# Patient Record
Sex: Male | Born: 1955 | Race: White | Hispanic: No | Marital: Married | Smoking: Never smoker
Health system: Southern US, Community
[De-identification: ages and names within clinical notes are randomized; demographics above are authoritative.]

## PROBLEM LIST (undated history)

## (undated) DIAGNOSIS — K5792 Diverticulitis of intestine, part unspecified, without perforation or abscess without bleeding: Secondary | ICD-10-CM

## (undated) DIAGNOSIS — F419 Anxiety disorder, unspecified: Secondary | ICD-10-CM

## (undated) DIAGNOSIS — R251 Tremor, unspecified: Secondary | ICD-10-CM

## (undated) DIAGNOSIS — F32A Depression, unspecified: Secondary | ICD-10-CM

## (undated) HISTORY — PX: COLONOSCOPY: SHX174

## (undated) HISTORY — PX: KNEE SURGERY: SHX244

---

## 2009-10-30 HISTORY — PX: COLONOSCOPY: SHX174

## 2010-07-21 ENCOUNTER — Ambulatory Visit: Payer: Self-pay | Admitting: Unknown Physician Specialty

## 2011-09-27 ENCOUNTER — Ambulatory Visit: Payer: Self-pay | Admitting: Unknown Physician Specialty

## 2013-10-31 ENCOUNTER — Ambulatory Visit: Payer: Self-pay | Admitting: Internal Medicine

## 2013-11-12 ENCOUNTER — Ambulatory Visit: Payer: Self-pay | Admitting: Internal Medicine

## 2013-12-05 ENCOUNTER — Emergency Department: Payer: Self-pay | Admitting: Emergency Medicine

## 2013-12-06 LAB — CBC
HCT: 45.2 % (ref 40.0–52.0)
HGB: 15.8 g/dL (ref 13.0–18.0)
MCH: 32.1 pg (ref 26.0–34.0)
MCHC: 34.9 g/dL (ref 32.0–36.0)
MCV: 92 fL (ref 80–100)
Platelet: 285 10*3/uL (ref 150–440)
RBC: 4.91 10*6/uL (ref 4.40–5.90)
RDW: 12.4 % (ref 11.5–14.5)
WBC: 7.6 10*3/uL (ref 3.8–10.6)

## 2013-12-06 LAB — BASIC METABOLIC PANEL
ANION GAP: 4 — AB (ref 7–16)
BUN: 14 mg/dL (ref 7–18)
CO2: 30 mmol/L (ref 21–32)
Calcium, Total: 8.6 mg/dL (ref 8.5–10.1)
Chloride: 105 mmol/L (ref 98–107)
Creatinine: 1.16 mg/dL (ref 0.60–1.30)
EGFR (Non-African Amer.): >60
Glucose: 130 mg/dL — ABNORMAL HIGH (ref 65–99)
Osmolality: 280 (ref 275–301)
Potassium: 3.5 mmol/L (ref 3.5–5.1)
SODIUM: 139 mmol/L (ref 136–145)

## 2013-12-06 LAB — CK: CK, Total: 269 U/L

## 2013-12-06 LAB — TSH: Thyroid Stimulating Horm: 3.38 u[IU]/mL

## 2013-12-06 LAB — TROPONIN I: Troponin-I: <0.02 ng/mL

## 2014-08-03 ENCOUNTER — Ambulatory Visit: Payer: Self-pay | Admitting: Internal Medicine

## 2015-10-01 IMAGING — CT CT ABD-PELV W/ CM
2 of 5 series · 16 of 46 positions shown, 18 images · IV contrast (isovue)
Comparison: None.

CLINICAL DATA: Left lower quadrant pain for 6 months with diarrhea

EXAM:
CT ABDOMEN AND PELVIS WITH CONTRAST
TECHNIQUE: Multidetector CT imaging of the abdomen and pelvis was performed
using the standard protocol following bolus administration of
intravenous contrast.
CONTRAST:  100 mL of Isovue 370

[Series 2: routine with · axial · 0.70mm/px · z∈[-918,-508]mm · 13 of 92 slices shown, 15 images]
[im 5/92  soft-tissue]
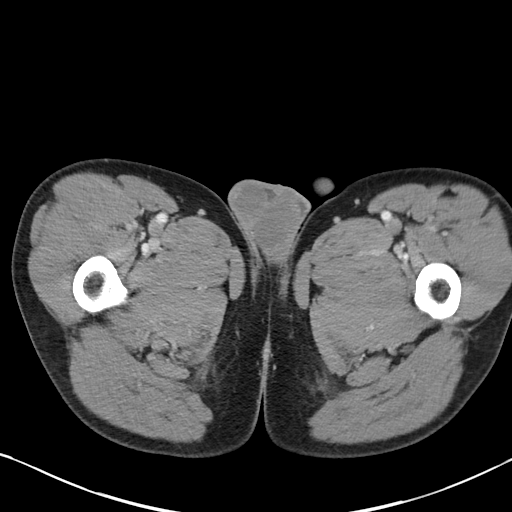
[im 5/92  bone]
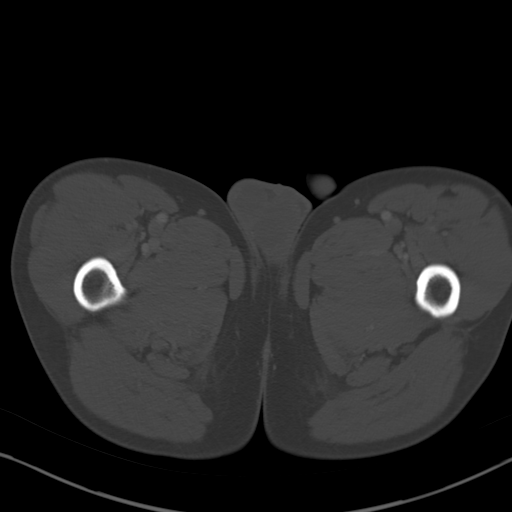
[im 15/92  soft-tissue]
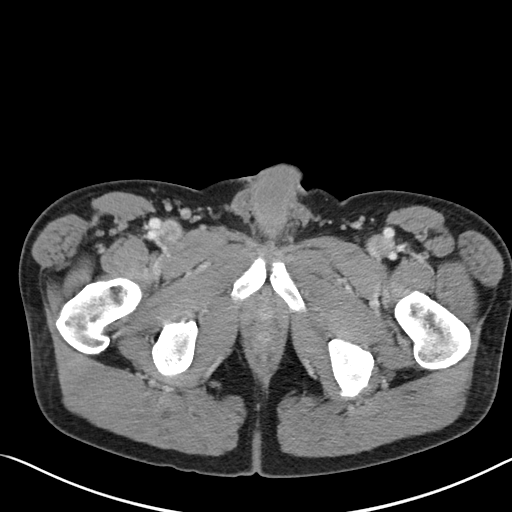
[im 20/92  soft-tissue]
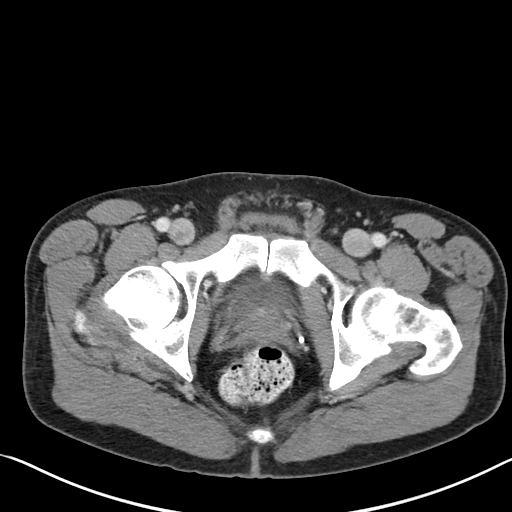
[im 24/92  soft-tissue]
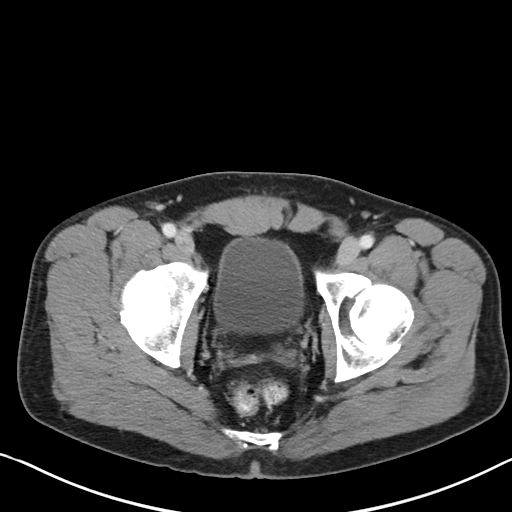
[im 34/92  soft-tissue]
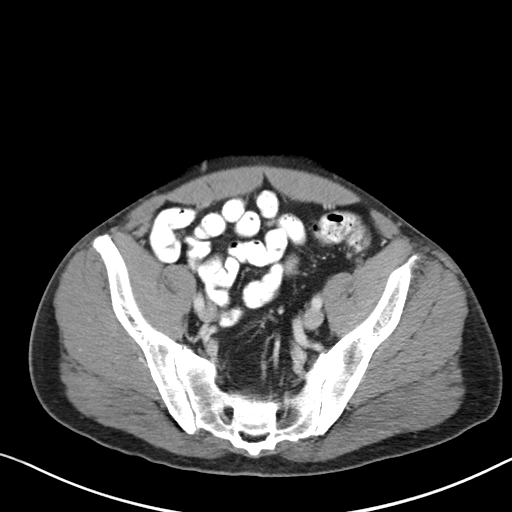
[im 39/92  soft-tissue]
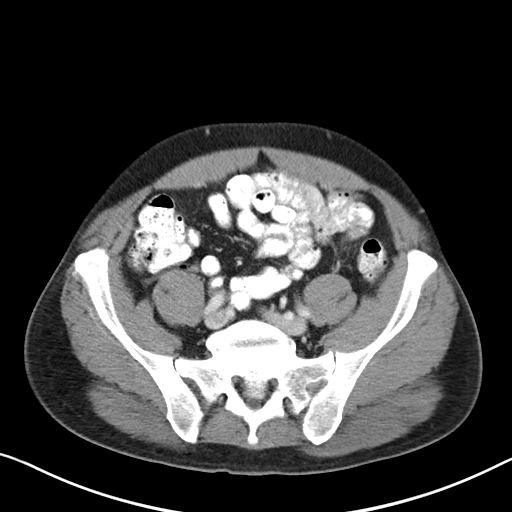
[im 48/92  soft-tissue]
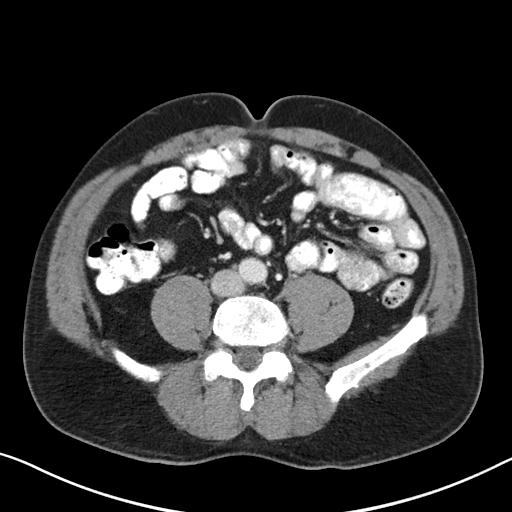
[im 53/92  soft-tissue]
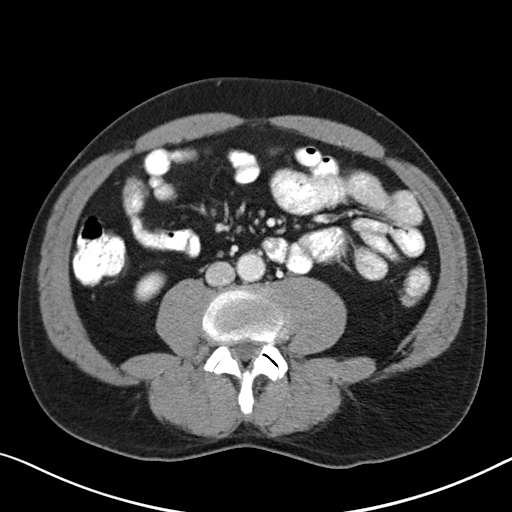
[im 58/92  soft-tissue]
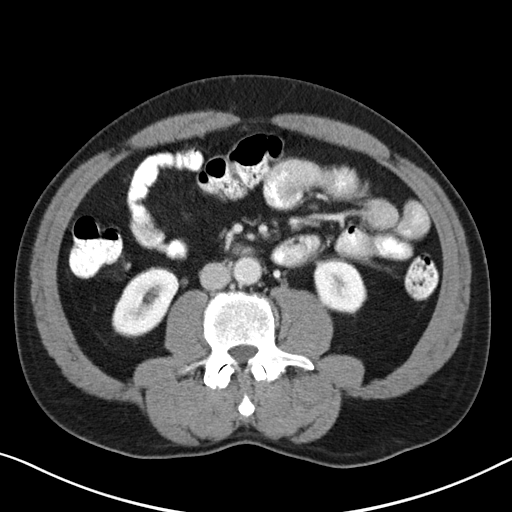
[im 58/92  bone]
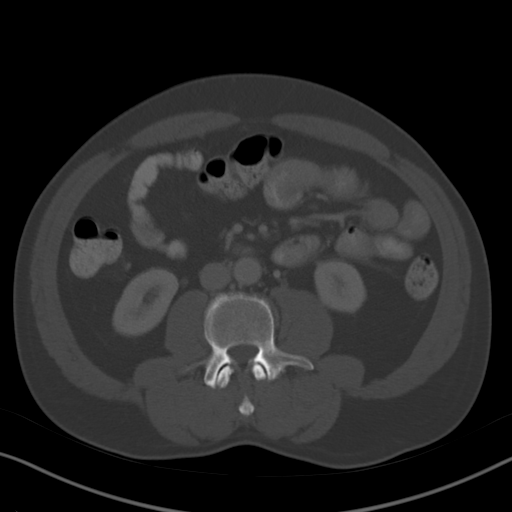
[im 68/92  soft-tissue]
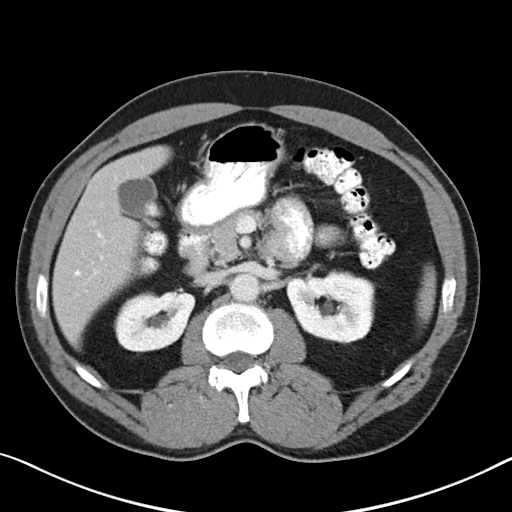
[im 72/92  soft-tissue]
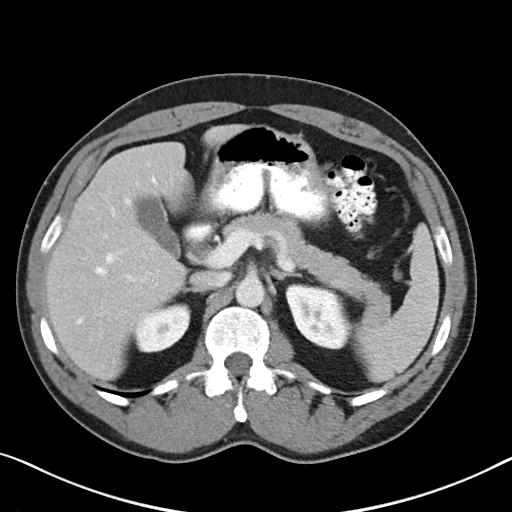
[im 77/92  soft-tissue]
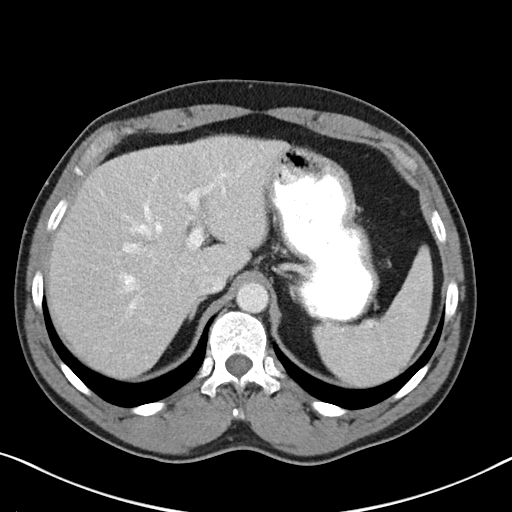
[im 87/92  soft-tissue]
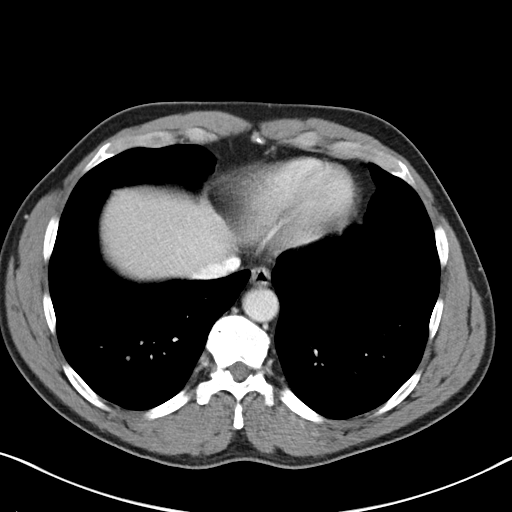

[Series 6: cor routine with · coronal · 0.77mm/px · 3 of 150 slices shown]
[im 50/150  soft-tissue]
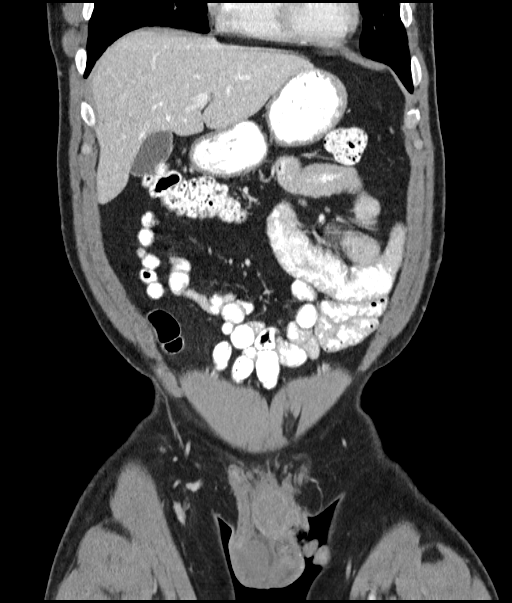
[im 67/150  soft-tissue]
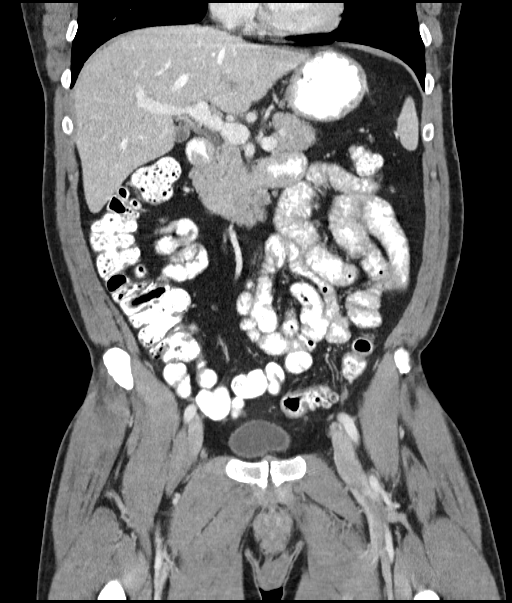
[im 83/150  soft-tissue]
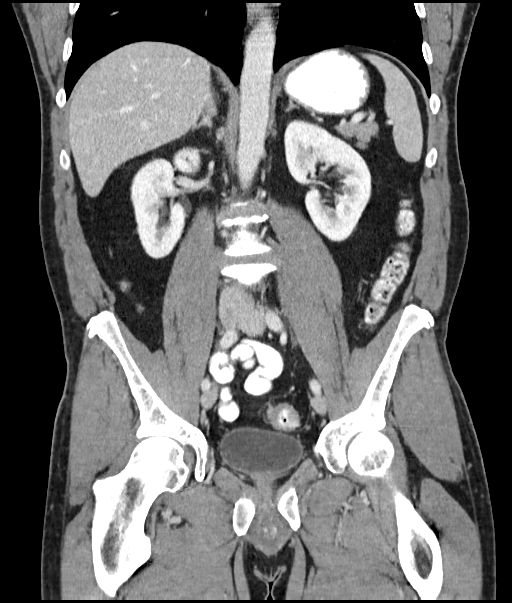

[16 of 46 positions shown; findings below may reference images not displayed]

FINDINGS: The lung bases are clear.

The liver demonstrates no focal abnormality. There is no
intrahepatic or extrahepatic biliary ductal dilatation. The
gallbladder is normal. The spleen demonstrates no focal abnormality.
There is a 3 mm nonobstructing left renal calculus in the midpole.
The right kidney, adrenal glands and pancreas are normal. The
bladder is unremarkable.

The stomach, duodenum, small intestine, and large intestine
demonstrate no contrast extravasation or dilatation. There is a
normal caliber appendix in the right lower quadrant without
periappendiceal inflammatory changes. There is no pneumoperitoneum,
pneumatosis, or portal venous gas. There is no abdominal or pelvic
free fluid. There is no lymphadenopathy.

The abdominal aorta is normal in caliber with atherosclerosis.

There is a 2.1 cm sclerotic lesion in the right side of the L4
vertebral body with a smaller 12 mm lesion slightly more posteriorly
within the L4 vertebral body. There is degenerative disc disease at
L5-S1.
IMPRESSION: 1.  No acute abdominal or pelvic pathology.

2. There is a 2.1 cm sclerotic lesion in the right side of the L4
vertebral body with a smaller 12 mm lesion slightly more posteriorly
within the L4 vertebral body. This may represent a hemangioma versus
other sclerotic lesion. Further evaluation with MRI may be helpful
in characterizing lesion.

3.  Nonobstructing left renal calculus.

## 2018-04-23 ENCOUNTER — Other Ambulatory Visit: Payer: Self-pay | Admitting: Internal Medicine

## 2018-04-23 DIAGNOSIS — M79661 Pain in right lower leg: Secondary | ICD-10-CM

## 2020-10-20 DIAGNOSIS — Z8616 Personal history of COVID-19: Secondary | ICD-10-CM

## 2020-10-20 HISTORY — DX: Personal history of COVID-19: Z86.16

## 2022-02-10 DIAGNOSIS — J209 Acute bronchitis, unspecified: Secondary | ICD-10-CM | POA: Diagnosis not present

## 2022-02-10 DIAGNOSIS — R509 Fever, unspecified: Secondary | ICD-10-CM | POA: Diagnosis not present

## 2022-02-10 DIAGNOSIS — R110 Nausea: Secondary | ICD-10-CM | POA: Diagnosis not present

## 2022-02-10 DIAGNOSIS — Z1331 Encounter for screening for depression: Secondary | ICD-10-CM | POA: Diagnosis not present

## 2022-02-10 DIAGNOSIS — R0982 Postnasal drip: Secondary | ICD-10-CM | POA: Diagnosis not present

## 2022-02-10 DIAGNOSIS — Z03818 Encounter for observation for suspected exposure to other biological agents ruled out: Secondary | ICD-10-CM | POA: Diagnosis not present

## 2022-02-10 DIAGNOSIS — Z0000 Encounter for general adult medical examination without abnormal findings: Secondary | ICD-10-CM | POA: Diagnosis not present

## 2022-04-07 DIAGNOSIS — H2513 Age-related nuclear cataract, bilateral: Secondary | ICD-10-CM | POA: Diagnosis not present

## 2022-05-16 DIAGNOSIS — H25042 Posterior subcapsular polar age-related cataract, left eye: Secondary | ICD-10-CM | POA: Diagnosis not present

## 2022-05-22 ENCOUNTER — Encounter: Payer: Self-pay | Admitting: Ophthalmology

## 2022-05-25 NOTE — Discharge Instructions (Signed)

## 2022-05-29 NOTE — Anesthesia Preprocedure Evaluation (Addendum)
Anesthesia Evaluation  Patient identified by MRN, date of birth, ID band Patient awake    Reviewed: Allergy & Precautions, NPO status , Patient's Chart, lab work & pertinent test results  History of Anesthesia Complications Negative for: history of anesthetic complications  Airway Mallampati: III  TM Distance: >3 FB Neck ROM: full    Dental  (+) Chipped   Pulmonary neg pulmonary ROS,    Pulmonary exam normal        Cardiovascular negative cardio ROS Normal cardiovascular exam     Neuro/Psych PSYCHIATRIC DISORDERS Anxiety Depression negative neurological ROS     GI/Hepatic negative GI ROS, Neg liver ROS,   Endo/Other  negative endocrine ROS  Renal/GU negative Renal ROS  negative genitourinary   Musculoskeletal negative musculoskeletal ROS (+)   Abdominal   Peds negative pediatric ROS (+)  Hematology negative hematology ROS (+)   Anesthesia Other Findings Past Medical History: No date: Anxiety No date: Depression No date: Diverticulitis 10/20/2020: History of COVID-19 No date: Tremor  Past Surgical History: 2011: COLONOSCOPY No date: COLONOSCOPY     Comment:  2015 No date: KNEE SURGERY; Left  BMI    Body Mass Index: 24.63 kg/m      Reproductive/Obstetrics negative OB ROS                             Anesthesia Physical Anesthesia Plan  ASA: 2  Anesthesia Plan: MAC   Post-op Pain Management:    Induction:   PONV Risk Score and Plan:   Airway Management Planned:   Additional Equipment:   Intra-op Plan:   Post-operative Plan:   Informed Consent:   Plan Discussed with: Anesthesiologist, CRNA and Surgeon  Anesthesia Plan Comments:         Anesthesia Quick Evaluation

## 2022-05-30 ENCOUNTER — Encounter: Payer: Self-pay | Admitting: Ophthalmology

## 2022-05-30 ENCOUNTER — Other Ambulatory Visit: Payer: Self-pay

## 2022-05-30 ENCOUNTER — Ambulatory Visit: Payer: No Typology Code available for payment source | Admitting: Anesthesiology

## 2022-05-30 ENCOUNTER — Encounter
Admission: RE | Disposition: A | Discharge status: Home / Self Care | Payer: Self-pay | Source: Home / Self Care | Attending: Ophthalmology

## 2022-05-30 ENCOUNTER — Ambulatory Visit
Admission: RE | Admit: 2022-05-30 | Discharge: 2022-05-30 | Disposition: A | Discharge status: Home / Self Care | Payer: No Typology Code available for payment source | Attending: Ophthalmology | Admitting: Ophthalmology

## 2022-05-30 ENCOUNTER — Ambulatory Visit (AMBULATORY_SURGERY_CENTER): Payer: No Typology Code available for payment source | Admitting: Anesthesiology

## 2022-05-30 DIAGNOSIS — H2512 Age-related nuclear cataract, left eye: Secondary | ICD-10-CM | POA: Insufficient documentation

## 2022-05-30 DIAGNOSIS — F418 Other specified anxiety disorders: Secondary | ICD-10-CM

## 2022-05-30 DIAGNOSIS — K5792 Diverticulitis of intestine, part unspecified, without perforation or abscess without bleeding: Secondary | ICD-10-CM

## 2022-05-30 HISTORY — PX: CATARACT EXTRACTION W/PHACO: SHX586

## 2022-05-30 HISTORY — DX: Diverticulitis of intestine, part unspecified, without perforation or abscess without bleeding: K57.92

## 2022-05-30 HISTORY — DX: Anxiety disorder, unspecified: F41.9

## 2022-05-30 HISTORY — DX: Tremor, unspecified: R25.1

## 2022-05-30 HISTORY — DX: Depression, unspecified: F32.A

## 2022-05-30 SURGERY — PHACOEMULSIFICATION, CATARACT, WITH IOL INSERTION
Anesthesia: Monitor Anesthesia Care | Site: Eye | Laterality: Left

## 2022-05-30 MED ORDER — SIGHTPATH DOSE#1 BSS IO SOLN
INTRAOCULAR | Status: DC | PRN
  Administered 2022-05-30: 75 mL via OPHTHALMIC

## 2022-05-30 MED ORDER — TETRACAINE HCL 0.5 % OP SOLN
1.0000 [drp] | OPHTHALMIC | Status: DC | PRN
  Administered 2022-05-30 (×3): 1 [drp] via OPHTHALMIC

## 2022-05-30 MED ORDER — SIGHTPATH DOSE#1 BSS IO SOLN
INTRAOCULAR | Status: DC | PRN
  Administered 2022-05-30: 15 mL

## 2022-05-30 MED ORDER — FENTANYL CITRATE (PF) 100 MCG/2ML IJ SOLN
INTRAMUSCULAR | Status: DC | PRN
Start: 2022-05-30 — End: 2022-05-30
  Administered 2022-05-30: 50 ug via INTRAVENOUS

## 2022-05-30 MED ORDER — MOXIFLOXACIN HCL 0.5 % OP SOLN
OPHTHALMIC | Status: DC | PRN
  Administered 2022-05-30: 0.2 mL via OPHTHALMIC

## 2022-05-30 MED ORDER — SIGHTPATH DOSE#1 NA CHONDROIT SULF-NA HYALURON 40-17 MG/ML IO SOLN
INTRAOCULAR | Status: DC | PRN
  Administered 2022-05-30: 1 mL via INTRAOCULAR

## 2022-05-30 MED ORDER — ARMC OPHTHALMIC DILATING DROPS
1.0000 | OPHTHALMIC | Status: DC | PRN
  Administered 2022-05-30 (×3): 1 via OPHTHALMIC

## 2022-05-30 MED ORDER — MIDAZOLAM HCL 2 MG/2ML IJ SOLN
INTRAMUSCULAR | Status: DC | PRN
  Administered 2022-05-30: 1 mg via INTRAVENOUS

## 2022-05-30 MED ORDER — BRIMONIDINE TARTRATE-TIMOLOL 0.2-0.5 % OP SOLN
OPHTHALMIC | Status: DC | PRN
  Administered 2022-05-30: 1 [drp] via OPHTHALMIC

## 2022-05-30 MED ORDER — SIGHTPATH DOSE#1 BSS IO SOLN
INTRAOCULAR | Status: DC | PRN
  Administered 2022-05-30: 1 mL via INTRAMUSCULAR

## 2022-05-30 SURGICAL SUPPLY — 10 items
CATARACT SUITE SIGHTPATH (MISCELLANEOUS) ×2 IMPLANT
FEE CATARACT SUITE SIGHTPATH (MISCELLANEOUS) ×1 IMPLANT
GLOVE SURG ENC TEXT LTX SZ8 (GLOVE) ×2 IMPLANT
GLOVE SURG TRIUMPH 8.0 PF LTX (GLOVE) ×2 IMPLANT
LENS IOL TECNIS EYHANCE 21.5 (Intraocular Lens) ×1 IMPLANT
NDL FILTER BLUNT 18X1 1/2 (NEEDLE) ×1 IMPLANT
NEEDLE FILTER BLUNT 18X 1/2SAF (NEEDLE) ×1
NEEDLE FILTER BLUNT 18X1 1/2 (NEEDLE) ×1 IMPLANT
SYR 3ML LL SCALE MARK (SYRINGE) ×2 IMPLANT
WATER STERILE IRR 250ML POUR (IV SOLUTION) ×2 IMPLANT

## 2022-05-30 NOTE — Op Note (Signed)
PREOPERATIVE DIAGNOSIS:  Nuclear sclerotic cataract of the left eye.   POSTOPERATIVE DIAGNOSIS:  Nuclear sclerotic cataract of the left eye.   OPERATIVE PROCEDURE:ORPROCALL@   SURGEON:  Galen Manila, MD.   ANESTHESIA:  Anesthesiologist: Foye Deer, MD CRNA: Lynden Oxford, CRNA  1.      Managed anesthesia care. 2.     0.55ml of Shugarcaine was instilled following the paracentesis   COMPLICATIONS:  None.   TECHNIQUE:   Stop and chop   DESCRIPTION OF PROCEDURE:  The patient was examined and consented in the preoperative holding area where the aforementioned topical anesthesia was applied to the left eye and then brought back to the Operating Room where the left eye was prepped and draped in the usual sterile ophthalmic fashion and a lid speculum was placed. A paracentesis was created with the side port blade and the anterior chamber was filled with viscoelastic. A near clear corneal incision was performed with the steel keratome. A continuous curvilinear capsulorrhexis was performed with a cystotome followed by the capsulorrhexis forceps. Hydrodissection and hydrodelineation were carried out with BSS on a blunt cannula. The lens was removed in a stop and chop  technique and the remaining cortical material was removed with the irrigation-aspiration handpiece. The capsular bag was inflated with viscoelastic and the Technis ZCB00 lens was placed in the capsular bag without complication. The remaining viscoelastic was removed from the eye with the irrigation-aspiration handpiece. The wounds were hydrated. The anterior chamber was flushed with BSS and the eye was inflated to physiologic pressure. 0.60ml Vigamox was placed in the anterior chamber. The wounds were found to be water tight. The eye was dressed with Combigan. The patient was given protective glasses to wear throughout the day and a shield with which to sleep tonight. The patient was also given drops with which to begin a  drop regimen today and will follow-up with me in one day. Implant Name Type Inv. Item Serial No. Manufacturer Lot No. LRB No. Used Action  LENS IOL TECNIS EYHANCE 21.5 - [id number] Intraocular Lens LENS IOL TECNIS EYHANCE 21.5 [id number] [employer]  Left 1 Implanted    Procedure(s) with comments: CATARACT EXTRACTION PHACO AND INTRAOCULAR LENS PLACEMENT (IOC) LEFT (Left) - 21.14 01:40.8  Electronically signed: Galen Manila 05/30/2022 11:24 AM

## 2022-05-30 NOTE — H&P (Signed)
[facility]   Primary Care Physician:  Patient, No Pcp Per Ophthalmologist: Dr. Maren Reamer  Pre-Procedure History & Physical: HPI:  Adam Holmes is a 66 y.o. male here for cataract surgery.   Past Medical History:  Diagnosis Date   Anxiety    Depression    Diverticulitis    History of COVID-19 10/20/2020   Tremor     Past Surgical History:  Procedure Laterality Date   COLONOSCOPY  2011   COLONOSCOPY     2015   KNEE SURGERY Left     Prior to Admission medications   Medication Sig Start Date End Date Taking? Authorizing Provider  famotidine (PEPCID) 20 MG tablet Take 20 mg by mouth 2 (two) times daily.   Yes [provider]  PHENIRAMINE-PHENYLEPHRINE PO Take by mouth.   Yes [provider]    Allergies as of 04/11/2022   (Not on File)    History reviewed. No pertinent family history.  Social History   Socioeconomic History   Marital status: Married    Spouse name: Not on file   Number of children: Not on file   Years of education: Not on file   Highest education level: Not on file  Occupational History   Not on file  Tobacco Use   Smoking status: Never   Smokeless tobacco: Not on file  Substance and Sexual Activity   Alcohol use: Not on file   Drug use: Not on file   Sexual activity: Not on file  Other Topics Concern   Not on file  Social History Narrative   Not on file   Social Determinants of Health   Financial Resource Strain: Not on file  Food Insecurity: Not on file  Transportation Needs: Not on file  Physical Activity: Not on file  Stress: Not on file  Social Connections: Not on file  Intimate Partner Violence: Not on file    Review of Systems: See HPI, otherwise negative ROS  Physical Exam: BP (!) 144/89   Pulse (!) 53   Temp 98 F (36.7 C) (Temporal)   Resp 20   Ht 5\' 8"  (1.727 m)   Wt 68.5 kg   SpO2 98%   BMI 22.96 kg/m  General:   Alert, cooperative in NAD Head:  Normocephalic and  atraumatic. Respiratory:  Normal work of breathing. Cardiovascular:  RRR  Impression/Plan: Holmes is here for cataract surgery.  Risks, benefits, limitations, and alternatives regarding cataract surgery have been reviewed with the patient.  Questions have been answered.  All parties agreeable.   Duke Salvia, MD  05/30/2022, 10:56 AM

## 2022-05-30 NOTE — Transfer of Care (Signed)
Immediate Anesthesia Transfer of Care Note  Patient: Adam Holmes  Procedure(s) Performed: CATARACT EXTRACTION PHACO AND INTRAOCULAR LENS PLACEMENT (IOC) LEFT (Left: Eye)  Patient Location: PACU  Anesthesia Type:MAC  Level of Consciousness: drowsy and patient cooperative  Airway & Oxygen Therapy: Patient Spontanous Breathing  Post-op Assessment: Report given to RN and Post -op Vital signs reviewed and stable  Post vital signs: Reviewed and stable  Last Vitals:  Vitals Value Taken Time  BP    Temp    Pulse    Resp    SpO2      Last Pain:  Vitals:   05/30/22 0952  TempSrc: Temporal  PainSc: 0-No pain         Complications: No notable events documented.

## 2022-05-30 NOTE — Anesthesia Postprocedure Evaluation (Signed)
Anesthesia Post Note  Patient: Adam Holmes  Procedure(s) Performed: CATARACT EXTRACTION PHACO AND INTRAOCULAR LENS PLACEMENT (IOC) LEFT (Left: Eye)     Patient location during evaluation: PACU Anesthesia Type: MAC Level of consciousness: awake and alert Pain management: pain level controlled Vital Signs Assessment: post-procedure vital signs reviewed and stable Respiratory status: spontaneous breathing, nonlabored ventilation and respiratory function stable Cardiovascular status: stable and blood pressure returned to baseline Postop Assessment: no apparent nausea or vomiting Anesthetic complications: no   No notable events documented.  [place] Ouch

## 2022-05-31 ENCOUNTER — Encounter: Payer: Self-pay | Admitting: Ophthalmology

## 2022-06-05 DIAGNOSIS — H2511 Age-related nuclear cataract, right eye: Secondary | ICD-10-CM | POA: Diagnosis not present

## 2022-06-07 NOTE — Discharge Instructions (Signed)

## 2022-06-10 NOTE — Anesthesia Preprocedure Evaluation (Signed)
Anesthesia Evaluation  Patient identified by MRN, date of birth, ID band Patient awake    Reviewed: Allergy & Precautions, NPO status , Patient's Chart, lab work & pertinent test results  History of Anesthesia Complications Negative for: history of anesthetic complications  Airway Mallampati: I  TM Distance: >3 FB Neck ROM: full    Dental no notable dental hx.    Pulmonary neg pulmonary ROS,    Pulmonary exam normal        Cardiovascular negative cardio ROS Normal cardiovascular exam     Neuro/Psych negative neurological ROS  negative psych ROS   GI/Hepatic negative GI ROS, Neg liver ROS,   Endo/Other  negative endocrine ROS  Renal/GU negative Renal ROS  negative genitourinary   Musculoskeletal negative musculoskeletal ROS (+)   Abdominal Normal abdominal exam  (+)   Peds negative pediatric ROS (+)  Hematology negative hematology ROS (+)   Anesthesia Other Findings Past Medical History: No date: Anxiety No date: Depression No date: Diverticulitis 10/20/2020: History of COVID-19 No date: Tremor  Past Surgical History: 2011: COLONOSCOPY No date: COLONOSCOPY     Comment:  2015 No date: KNEE SURGERY; Left  BMI    Body Mass Index: 24.63 kg/m      Reproductive/Obstetrics negative OB ROS                             Anesthesia Physical  Anesthesia Plan  ASA: 2  Anesthesia Plan: MAC   Post-op Pain Management:    Induction:   PONV Risk Score and Plan: Treatment may vary due to age or medical condition  Airway Management Planned: Natural Airway  Additional Equipment:   Intra-op Plan:   Post-operative Plan:   Informed Consent:     Dental advisory given  Plan Discussed with: Anesthesiologist, CRNA and Surgeon  Anesthesia Plan Comments:         Anesthesia Quick Evaluation

## 2022-06-13 ENCOUNTER — Ambulatory Visit: Payer: No Typology Code available for payment source | Admitting: Anesthesiology

## 2022-06-13 ENCOUNTER — Ambulatory Visit
Admission: RE | Admit: 2022-06-13 | Discharge: 2022-06-13 | Disposition: A | Discharge status: Home / Self Care | Payer: No Typology Code available for payment source | Attending: Ophthalmology | Admitting: Ophthalmology

## 2022-06-13 ENCOUNTER — Encounter: Payer: Self-pay | Admitting: Ophthalmology

## 2022-06-13 ENCOUNTER — Other Ambulatory Visit: Payer: Self-pay

## 2022-06-13 ENCOUNTER — Ambulatory Visit (AMBULATORY_SURGERY_CENTER): Payer: No Typology Code available for payment source | Admitting: Anesthesiology

## 2022-06-13 ENCOUNTER — Encounter
Admission: RE | Disposition: A | Discharge status: Home / Self Care | Payer: Self-pay | Source: Home / Self Care | Attending: Ophthalmology

## 2022-06-13 DIAGNOSIS — F418 Other specified anxiety disorders: Secondary | ICD-10-CM

## 2022-06-13 DIAGNOSIS — H2511 Age-related nuclear cataract, right eye: Secondary | ICD-10-CM

## 2022-06-13 HISTORY — PX: CATARACT EXTRACTION W/PHACO: SHX586

## 2022-06-13 SURGERY — PHACOEMULSIFICATION, CATARACT, WITH IOL INSERTION
Anesthesia: Monitor Anesthesia Care | Site: Eye | Laterality: Right

## 2022-06-13 MED ORDER — FENTANYL CITRATE (PF) 100 MCG/2ML IJ SOLN
INTRAMUSCULAR | Status: DC | PRN
  Administered 2022-06-13: 50 ug via INTRAVENOUS

## 2022-06-13 MED ORDER — BRIMONIDINE TARTRATE-TIMOLOL 0.2-0.5 % OP SOLN
OPHTHALMIC | Status: DC | PRN
  Administered 2022-06-13: 1 [drp] via OPHTHALMIC

## 2022-06-13 MED ORDER — MOXIFLOXACIN HCL 0.5 % OP SOLN
OPHTHALMIC | Status: DC | PRN
  Administered 2022-06-13: 0.2 mL via OPHTHALMIC

## 2022-06-13 MED ORDER — MIDAZOLAM HCL 2 MG/2ML IJ SOLN
INTRAMUSCULAR | Status: DC | PRN
  Administered 2022-06-13: 1 mg via INTRAVENOUS

## 2022-06-13 MED ORDER — TETRACAINE HCL 0.5 % OP SOLN
1.0000 [drp] | OPHTHALMIC | Status: DC | PRN
  Administered 2022-06-13 (×3): 1 [drp] via OPHTHALMIC

## 2022-06-13 MED ORDER — ARMC OPHTHALMIC DILATING DROPS
1.0000 | OPHTHALMIC | Status: DC | PRN
Start: 2022-06-13 — End: 2022-06-13
  Administered 2022-06-13 (×3): 1 via OPHTHALMIC

## 2022-06-13 MED ORDER — SIGHTPATH DOSE#1 NA CHONDROIT SULF-NA HYALURON 40-17 MG/ML IO SOLN
INTRAOCULAR | Status: DC | PRN
  Administered 2022-06-13: 1 mL via INTRAOCULAR

## 2022-06-13 MED ORDER — SIGHTPATH DOSE#1 BSS IO SOLN
INTRAOCULAR | Status: DC | PRN
  Administered 2022-06-13: 1 mL via INTRAMUSCULAR

## 2022-06-13 MED ORDER — SIGHTPATH DOSE#1 BSS IO SOLN
INTRAOCULAR | Status: DC | PRN
  Administered 2022-06-13: 15 mL

## 2022-06-13 MED ORDER — SIGHTPATH DOSE#1 BSS IO SOLN
INTRAOCULAR | Status: DC | PRN
  Administered 2022-06-13: 71 mL via OPHTHALMIC

## 2022-06-13 SURGICAL SUPPLY — 10 items
CATARACT SUITE SIGHTPATH (MISCELLANEOUS) ×2 IMPLANT
FEE CATARACT SUITE SIGHTPATH (MISCELLANEOUS) ×1 IMPLANT
GLOVE SURG ENC TEXT LTX SZ8 (GLOVE) ×2 IMPLANT
GLOVE SURG TRIUMPH 8.0 PF LTX (GLOVE) ×2 IMPLANT
LENS IOL TECNIS EYHANCE 22.0 (Intraocular Lens) ×1 IMPLANT
NDL FILTER BLUNT 18X1 1/2 (NEEDLE) ×1 IMPLANT
NEEDLE FILTER BLUNT 18X 1/2SAF (NEEDLE) ×1
NEEDLE FILTER BLUNT 18X1 1/2 (NEEDLE) ×1 IMPLANT
SYR 3ML LL SCALE MARK (SYRINGE) ×2 IMPLANT
WATER STERILE IRR 250ML POUR (IV SOLUTION) ×2 IMPLANT

## 2022-06-13 NOTE — Anesthesia Postprocedure Evaluation (Signed)
Anesthesia Post Note  Patient: Adam Holmes  Procedure(s) Performed: CATARACT EXTRACTION PHACO AND INTRAOCULAR LENS PLACEMENT (IOC) RIGHT (Right: Eye)     Patient location during evaluation: PACU Anesthesia Type: MAC Level of consciousness: awake and alert Pain management: pain level controlled Vital Signs Assessment: post-procedure vital signs reviewed and stable Respiratory status: spontaneous breathing, nonlabored ventilation and respiratory function stable Cardiovascular status: blood pressure returned to baseline and stable Postop Assessment: no apparent nausea or vomiting Anesthetic complications: no   There were no known notable events for this encounter.  [place] Ouch

## 2022-06-13 NOTE — Transfer of Care (Signed)
Immediate Anesthesia Transfer of Care Note  Patient: Adam Holmes  Procedure(s) Performed: CATARACT EXTRACTION PHACO AND INTRAOCULAR LENS PLACEMENT (IOC) RIGHT (Right: Eye)  Patient Location: PACU  Anesthesia Type: MAC  Level of Consciousness: awake, alert  and patient cooperative  Airway and Oxygen Therapy: Patient Spontanous Breathing and Patient connected to supplemental oxygen  Post-op Assessment: Post-op Vital signs reviewed, Patient's Cardiovascular Status Stable, Respiratory Function Stable, Patent Airway and No signs of Nausea or vomiting  Post-op Vital Signs: Reviewed and stable  Complications: There were no known notable events for this encounter.

## 2022-06-13 NOTE — Op Note (Signed)
PREOPERATIVE DIAGNOSIS:  Nuclear sclerotic cataract of the right eye.   POSTOPERATIVE DIAGNOSIS:  H25.11 Cataract   OPERATIVE PROCEDURE:ORPROCALL@   SURGEON:  Galen Manila, MD.   ANESTHESIA:  Anesthesiologist: Foye Deer, MD CRNA: Sharyn Dross, CRNA  1.      Managed anesthesia care. 2.      0.22ml of Shugarcaine was instilled in the eye following the paracentesis.   COMPLICATIONS:  None.   TECHNIQUE:   Stop and chop   DESCRIPTION OF PROCEDURE:  The patient was examined and consented in the preoperative holding area where the aforementioned topical anesthesia was applied to the right eye and then brought back to the Operating Room where the right eye was prepped and draped in the usual sterile ophthalmic fashion and a lid speculum was placed. A paracentesis was created with the side port blade and the anterior chamber was filled with viscoelastic. A near clear corneal incision was performed with the steel keratome. A continuous curvilinear capsulorrhexis was performed with a cystotome followed by the capsulorrhexis forceps. Hydrodissection and hydrodelineation were carried out with BSS on a blunt cannula. The lens was removed in a stop and chop  technique and the remaining cortical material was removed with the irrigation-aspiration handpiece. The capsular bag was inflated with viscoelastic and the Technis ZCB00  lens was placed in the capsular bag without complication. The remaining viscoelastic was removed from the eye with the irrigation-aspiration handpiece. The wounds were hydrated. The anterior chamber was flushed with BSS and the eye was inflated to physiologic pressure. 0.42ml of Vigamox was placed in the anterior chamber. The wounds were found to be water tight. The eye was dressed with Combigan. The patient was given protective glasses to wear throughout the day and a shield with which to sleep tonight. The patient was also given drops with which to begin a drop regimen  today and will follow-up with me in one day. Implant Name Type Inv. Item Serial No. Manufacturer Lot No. LRB No. Used Action  LENS IOL TECNIS EYHANCE 22.0 - [id number] Intraocular Lens LENS IOL TECNIS EYHANCE 22.0 [id number] [employer]  Right 1 Implanted   Procedure(s) with comments: CATARACT EXTRACTION PHACO AND INTRAOCULAR LENS PLACEMENT (IOC) RIGHT (Right) - 15.89 01:23.4  Electronically signed: Galen Manila 06/13/2022 8:56 AM

## 2022-06-13 NOTE — H&P (Signed)
[facility]   Primary Care Physician:  Patient, No Pcp Per Ophthalmologist: Dr. Druscilla Brownie  Pre-Procedure History & Physical: HPI:  Adam Holmes is a 66 y.o. male here for cataract surgery.   Past Medical History:  Diagnosis Date   Anxiety    Depression    Diverticulitis    History of COVID-19 10/20/2020   Tremor     Past Surgical History:  Procedure Laterality Date   CATARACT EXTRACTION W/PHACO Left 05/30/2022   Procedure: CATARACT EXTRACTION PHACO AND INTRAOCULAR LENS PLACEMENT (IOC) LEFT;  Surgeon: Galen Manila, MD;  Location: [facility];  Service: Ophthalmology;  Laterality: Left;  21.14 01:40.8   COLONOSCOPY  2011   COLONOSCOPY     2015   KNEE SURGERY Left     Prior to Admission medications   Medication Sig Start Date End Date Taking? Authorizing Provider  famotidine (PEPCID) 20 MG tablet Take 20 mg by mouth 2 (two) times daily.   Yes [provider]  PHENIRAMINE-PHENYLEPHRINE PO Take by mouth.    [provider]    Allergies as of 04/11/2022   (Not on File)    History reviewed. No pertinent family history.  Social History   Socioeconomic History   Marital status: Married    Spouse name: Not on file   Number of children: Not on file   Years of education: Not on file   Highest education level: Not on file  Occupational History   Not on file  Tobacco Use   Smoking status: Never   Smokeless tobacco: Not on file  Substance and Sexual Activity   Alcohol use: Not on file   Drug use: Not on file   Sexual activity: Not on file  Other Topics Concern   Not on file  Social History Narrative   Not on file   Social Determinants of Health   Financial Resource Strain: Not on file  Food Insecurity: Not on file  Transportation Needs: Not on file  Physical Activity: Not on file  Stress: Not on file  Social Connections: Not on file  Intimate Partner Violence: Not on file    Review of Systems: See HPI, otherwise  negative ROS  Physical Exam: BP 132/89   Pulse (!) 51   Temp 97.9 F (36.6 C) (Temporal)   Resp 18   Ht 5\' 8"  (1.727 m)   Wt 68.9 kg   SpO2 98%   BMI 23.11 kg/m  General:   Alert, cooperative in NAD Head:  Normocephalic and atraumatic. Respiratory:  Normal work of breathing. Cardiovascular:  RRR  Impression/Plan: Holmes is here for cataract surgery.  Risks, benefits, limitations, and alternatives regarding cataract surgery have been reviewed with the patient.  Questions have been answered.  All parties agreeable.   Duke Salvia, MD  06/13/2022, 8:29 AM

## 2022-06-14 ENCOUNTER — Encounter: Payer: Self-pay | Admitting: Ophthalmology

## 2022-07-18 DIAGNOSIS — Z008 Encounter for other general examination: Secondary | ICD-10-CM | POA: Diagnosis not present

## 2022-07-18 DIAGNOSIS — Z6823 Body mass index (BMI) 23.0-23.9, adult: Secondary | ICD-10-CM | POA: Diagnosis not present

## 2022-07-21 DIAGNOSIS — Z0000 Encounter for general adult medical examination without abnormal findings: Secondary | ICD-10-CM | POA: Diagnosis not present

## 2022-07-21 DIAGNOSIS — Z125 Encounter for screening for malignant neoplasm of prostate: Secondary | ICD-10-CM | POA: Diagnosis not present

## 2022-07-21 DIAGNOSIS — Z1329 Encounter for screening for other suspected endocrine disorder: Secondary | ICD-10-CM | POA: Diagnosis not present

## 2022-07-21 DIAGNOSIS — Z131 Encounter for screening for diabetes mellitus: Secondary | ICD-10-CM | POA: Diagnosis not present

## 2022-07-21 DIAGNOSIS — Z136 Encounter for screening for cardiovascular disorders: Secondary | ICD-10-CM | POA: Diagnosis not present

## 2022-07-25 DIAGNOSIS — F419 Anxiety disorder, unspecified: Secondary | ICD-10-CM | POA: Diagnosis not present

## 2022-07-25 DIAGNOSIS — Z0000 Encounter for general adult medical examination without abnormal findings: Secondary | ICD-10-CM | POA: Diagnosis not present

## 2022-07-25 DIAGNOSIS — H6121 Impacted cerumen, right ear: Secondary | ICD-10-CM | POA: Diagnosis not present

## 2022-07-25 DIAGNOSIS — K5790 Diverticulosis of intestine, part unspecified, without perforation or abscess without bleeding: Secondary | ICD-10-CM | POA: Diagnosis not present

## 2022-07-25 DIAGNOSIS — Z1331 Encounter for screening for depression: Secondary | ICD-10-CM | POA: Diagnosis not present

## 2022-08-29 DIAGNOSIS — D2262 Melanocytic nevi of left upper limb, including shoulder: Secondary | ICD-10-CM | POA: Diagnosis not present

## 2022-08-29 DIAGNOSIS — D2271 Melanocytic nevi of right lower limb, including hip: Secondary | ICD-10-CM | POA: Diagnosis not present

## 2022-08-29 DIAGNOSIS — D225 Melanocytic nevi of trunk: Secondary | ICD-10-CM | POA: Diagnosis not present

## 2022-08-29 DIAGNOSIS — D2261 Melanocytic nevi of right upper limb, including shoulder: Secondary | ICD-10-CM | POA: Diagnosis not present

## 2022-08-29 DIAGNOSIS — L570 Actinic keratosis: Secondary | ICD-10-CM | POA: Diagnosis not present

## 2022-08-29 DIAGNOSIS — D2272 Melanocytic nevi of left lower limb, including hip: Secondary | ICD-10-CM | POA: Diagnosis not present

## 2022-08-29 DIAGNOSIS — L814 Other melanin hyperpigmentation: Secondary | ICD-10-CM | POA: Diagnosis not present

## 2022-08-29 DIAGNOSIS — L821 Other seborrheic keratosis: Secondary | ICD-10-CM | POA: Diagnosis not present

## 2022-10-04 DIAGNOSIS — H1013 Acute atopic conjunctivitis, bilateral: Secondary | ICD-10-CM | POA: Diagnosis not present

## 2023-04-27 ENCOUNTER — Other Ambulatory Visit: Payer: Self-pay

## 2023-04-27 ENCOUNTER — Emergency Department
Admission: EM | Admit: 2023-04-27 | Discharge: 2023-04-27 | Disposition: A | Discharge status: Home / Self Care | Payer: Medicare HMO | Attending: Emergency Medicine | Admitting: Emergency Medicine

## 2023-04-27 ENCOUNTER — Emergency Department: Payer: Medicare HMO

## 2023-04-27 DIAGNOSIS — K5732 Diverticulitis of large intestine without perforation or abscess without bleeding: Secondary | ICD-10-CM | POA: Insufficient documentation

## 2023-04-27 DIAGNOSIS — R1032 Left lower quadrant pain: Secondary | ICD-10-CM | POA: Diagnosis present

## 2023-04-27 DIAGNOSIS — K5792 Diverticulitis of intestine, part unspecified, without perforation or abscess without bleeding: Secondary | ICD-10-CM

## 2023-04-27 LAB — CBC
HCT: 46.4 % (ref 39.0–52.0)
Hemoglobin: 15.7 g/dL (ref 13.0–17.0)
MCH: 30.8 pg (ref 26.0–34.0)
MCHC: 33.8 g/dL (ref 30.0–36.0)
MCV: 91.2 fL (ref 80.0–100.0)
Platelets: 288 10*3/uL (ref 150–400)
RBC: 5.09 MIL/uL (ref 4.22–5.81)
RDW: 12 % (ref 11.5–15.5)
WBC: 10 10*3/uL (ref 4.0–10.5)
nRBC: 0 % (ref 0.0–0.2)

## 2023-04-27 LAB — COMPREHENSIVE METABOLIC PANEL
ALT: 19 U/L (ref 0–44)
AST: 25 U/L (ref 15–41)
Albumin: 3.9 g/dL (ref 3.5–5.0)
Alkaline Phosphatase: 53 U/L (ref 38–126)
Anion gap: 9 (ref 5–15)
BUN: 14 mg/dL (ref 8–23)
CO2: 27 mmol/L (ref 22–32)
Calcium: 9.1 mg/dL (ref 8.9–10.3)
Chloride: 103 mmol/L (ref 98–111)
Creatinine, Ser: 1.02 mg/dL (ref 0.61–1.24)
GFR, Estimated: >60 mL/min (ref >60)
Glucose, Bld: 110 mg/dL — ABNORMAL HIGH (ref 70–99)
Potassium: 3.4 mmol/L — ABNORMAL LOW (ref 3.5–5.1)
Sodium: 139 mmol/L (ref 135–145)
Total Bilirubin: 1.2 mg/dL (ref 0.3–1.2)
Total Protein: 7.6 g/dL (ref 6.5–8.1)

## 2023-04-27 LAB — URINALYSIS, ROUTINE W REFLEX MICROSCOPIC
Bacteria, UA: NONE SEEN
Bilirubin Urine: NEGATIVE
Glucose, UA: NEGATIVE mg/dL
Ketones, ur: 5 mg/dL — AB
Leukocytes,Ua: NEGATIVE
Nitrite: NEGATIVE
Protein, ur: NEGATIVE mg/dL
Specific Gravity, Urine: 1.021 (ref 1.005–1.030)
Squamous Epithelial / HPF: NONE SEEN /HPF (ref 0–5)
pH: 5 (ref 5.0–8.0)

## 2023-04-27 LAB — LIPASE, BLOOD: Lipase: 29 U/L (ref 11–51)

## 2023-04-27 MED ORDER — IOHEXOL 300 MG/ML  SOLN
80.0000 mL | Freq: Once | INTRAMUSCULAR | Status: AC | PRN
  Administered 2023-04-27: 80 mL via INTRAVENOUS

## 2023-04-27 NOTE — ED Provider Notes (Signed)
[facility] Provider Note    Event Date/Time   First MD Initiated Contact with Patient 04/27/23 [phone]     (approximate)   History   Abdominal Pain   HPI  Adam Holmes is a 67 y.o. male with history of diverticulitis presenting to the emergency department for evaluation of abdominal pain.  On Monday or Tuesday, patient began to develop some suprapubic and left lower quadrant abdominal pain, similar to prior episodes of diverticulitis but slightly more severe.  He was seen by his PCP on Wednesday and started on Augmentin.  Since then, he has noticed some increased bloating and some difficulty having bowel movements.  Reports mucousy stools yesterday and a small bowel movement today.  Saw his primary care doctor today to discuss possible bowel regimen, but it was recommended that he present to the ER to further evaluate for potential complications of his diverticulitis, bowel obstruction, other acute intra-abdominal process.  Patient denies dysuria.  Thinks he may have had a subjective fever earlier this week.    Physical Exam   Triage Vital Signs: ED Triage Vitals  Enc Vitals Group     BP 04/27/23 0834 (!) 152/103     Pulse Rate 04/27/23 0834 65     Resp 04/27/23 0834 16     Temp 04/27/23 0834 97.9 F (36.6 C)     Temp Source 04/27/23 0834 Oral     SpO2 04/27/23 0834 97 %     Weight --      Height --      Head Circumference --      Peak Flow --      Pain Score 04/27/23 0831 9     Pain Loc --      Pain Edu? --      Excl. in GC? --     Most recent vital signs: Vitals:   04/27/23 0900 04/27/23 0915  BP: (!) 141/83   Pulse: 61 63  Resp:    Temp:    SpO2: 98% 99%     General: Awake, interactive  CV:  Regular rate, good peripheral perfusion.  Resp:  Lungs clear, unlabored respirations.  Abd:  Soft, nondistended, tenderness palpation of the left lower quadrant without rebound or guarding, remainder of abdomen nontender Neuro:  Symmetric  facial movement, fluid speech   ED Results / Procedures / Treatments   Labs (all labs ordered are listed, but only abnormal results are displayed) Labs Reviewed  COMPREHENSIVE METABOLIC PANEL - Abnormal; Notable for the following components:      Result Value   Potassium 3.4 (*)    Glucose, Bld 110 (*)    All other components within normal limits  URINALYSIS, ROUTINE W REFLEX MICROSCOPIC - Abnormal; Notable for the following components:   Color, Urine YELLOW (*)    APPearance HAZY (*)    Hgb urine dipstick SMALL (*)    Ketones, ur 5 (*)    All other components within normal limits  LIPASE, BLOOD  CBC     EKG EKG independently reviewed interpreted by myself (ER attending) demonstrates:    RADIOLOGY Imaging independently reviewed and interpreted by myself demonstrates:  CT abdomen pelvis demonstrates diverticulitis without abscess or perforation  PROCEDURES:  Critical Care performed: No  Procedures   MEDICATIONS ORDERED IN ED: Medications  iohexol (OMNIPAQUE) 300 MG/ML solution 80 mL (80 mLs Intravenous Contrast Given 04/27/23 1013)     IMPRESSION / MDM / ASSESSMENT AND PLAN / ED COURSE  I reviewed the triage vital signs and the nursing notes.  Differential diagnosis includes, but is not limited to, diverticulitis with or without complication such as abscess or perforation, SBO, cystitis, colitis  Patient's presentation is most consistent with acute presentation with potential threat to life or bodily function.  67 year old male presenting with left lower quadrant abdominal pain and decreased bowel movements.  Hemodynamically stable here.  Will obtain labs, CT to further evaluate.  CT here does confirm diverticulitis without evidence of complication.  Patient reevaluated.  Symptoms well-controlled.  He is comfortable with discharge home.  Already on antibiotics.  Strict return precautions provided.  Patient discharged in stable condition.        FINAL  CLINICAL IMPRESSION(S) / ED DIAGNOSES   Final diagnoses:  Acute diverticulitis     Rx / DC Orders   ED Discharge Orders     None        Note:  This document was prepared using Dragon voice recognition software and may include unintentional dictation errors.   Trinna Post, MD 04/27/23 [phone]

## 2023-04-27 NOTE — ED Triage Notes (Signed)
Pt presents to ED with c/o ABD pain, pt states recent DX of diverticulitis. Pt taking Augmentin at this time. Pt states pain in LLQ.

## 2023-04-27 NOTE — Discharge Instructions (Addendum)
You were seen in the emergency department today for evaluation of your abdominal pain.  Your lab work was overall reassuring.  Your CT scan did show signs of diverticulitis, but fortunately did not show complications of this.  Please continue to take your prescribed antibiotic as directed.  Your CT did not show signs of a bowel obstruction.  Have included information about managing constipation below.  Follow-up with your primary care doctor within a few days for reevaluation of your symptoms of not improved.  Return to the ER for any new or worsening symptoms.  To help prevent constipation in the future please: Eat a high-fiber diet Drink water and other fluids during the day Go to the bathroom at regular times every day  To treat your constipation:  Take a capful of MiraLAX 1-2 times daily You can also try over-the-counter stool softener such as Dulcolax You can use over-the-counter medications such as senna, but only use this for a few days, do not use this for prolonged period If you are still having constipation, you can try an over-the-counter suppository or enema

## 2024-01-02 ENCOUNTER — Ambulatory Visit: Payer: Self-pay

## 2024-01-02 DIAGNOSIS — K602 Anal fissure, unspecified: Secondary | ICD-10-CM | POA: Diagnosis not present

## 2024-01-02 DIAGNOSIS — Z8719 Personal history of other diseases of the digestive system: Secondary | ICD-10-CM | POA: Diagnosis not present

## 2024-01-02 DIAGNOSIS — K5730 Diverticulosis of large intestine without perforation or abscess without bleeding: Secondary | ICD-10-CM | POA: Diagnosis not present

## 2024-01-02 DIAGNOSIS — Z09 Encounter for follow-up examination after completed treatment for conditions other than malignant neoplasm: Secondary | ICD-10-CM | POA: Diagnosis present

## 2024-01-02 DIAGNOSIS — K640 First degree hemorrhoids: Secondary | ICD-10-CM | POA: Diagnosis not present
